# Patient Record
Sex: Female | Born: 1995 | Race: Asian | Hispanic: No | Marital: Married | Smoking: Former smoker
Health system: Southern US, Community
[De-identification: ages and names within clinical notes are randomized; demographics above are authoritative.]

## PROBLEM LIST (undated history)

## (undated) DIAGNOSIS — O24419 Gestational diabetes mellitus in pregnancy, unspecified control: Secondary | ICD-10-CM

## (undated) HISTORY — PX: WISDOM TOOTH EXTRACTION: SHX21

---
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
## 2016-07-08 ENCOUNTER — Emergency Department (HOSPITAL_COMMUNITY)
Admission: EM | Admit: 2016-07-08 | Discharge: 2016-07-08 | Disposition: A | Discharge status: Home / Self Care | Payer: Medicaid Other | Attending: Emergency Medicine | Admitting: Emergency Medicine

## 2016-07-08 ENCOUNTER — Encounter (HOSPITAL_COMMUNITY): Payer: Self-pay | Admitting: *Deleted

## 2016-07-08 ENCOUNTER — Emergency Department (HOSPITAL_COMMUNITY): Payer: Medicaid Other

## 2016-07-08 DIAGNOSIS — F17200 Nicotine dependence, unspecified, uncomplicated: Secondary | ICD-10-CM | POA: Diagnosis not present

## 2016-07-08 DIAGNOSIS — J069 Acute upper respiratory infection, unspecified: Secondary | ICD-10-CM | POA: Insufficient documentation

## 2016-07-08 DIAGNOSIS — B9789 Other viral agents as the cause of diseases classified elsewhere: Secondary | ICD-10-CM

## 2016-07-08 DIAGNOSIS — R05 Cough: Secondary | ICD-10-CM | POA: Diagnosis present

## 2016-07-08 MED ORDER — PSEUDOEPHEDRINE HCL 60 MG PO TABS: 60.0000 mg | ORAL_TABLET | Freq: Four times a day (QID) | ORAL | 0 refills | Status: DC | PRN

## 2016-07-08 MED ORDER — BENZONATATE 100 MG PO CAPS: 200.0000 mg | ORAL_CAPSULE | Freq: Two times a day (BID) | ORAL | 0 refills | Status: DC | PRN

## 2016-07-08 NOTE — ED Notes (Signed)
Patient in Xray, mom taken into pts room

## 2016-07-08 NOTE — Discharge Instructions (Signed)
Take your medications as prescribed. I also recommend continuing to drink fluids at stay hydrated at home, using a humidifier and voice rest. Please follow up with a primary care provider from the Resource Guide provided below in 1 week as needed. Please return to the Emergency Department if symptoms worsen or new onset of fever, headache, neck stiffness, productive cough, coughing up blood, difficulty breathing, chest pain, vomiting.

## 2016-07-08 NOTE — ED Provider Notes (Signed)
[facility]-EMERGENCY DEPT Provider Note   CSN: [id number] Arrival date & time: 07/08/16  [id number]     History   Chief Complaint Chief Complaint  Patient presents with  . Cough    HPI Peggy Wise is a 20 y.o. female.  Patient is a 20 year old female with no pertinent past medical history who presents the ED with complaint of cough, onset 3 days. Patient reports on her flight home from [place] she began having a nonproductive cough. Endorses associated nasal congestion and hoarse voice from coughin. Patient states she has been taking Delsym without relief. Denies fever, chills, body ache, headache, lightheadedness, dizziness, neck stiffness, ear pain, rhinorrhea, sore throat, hemoptysis, shortness of breath, wheezing, chest pain, abdominal pain, nausea, vomiting, diarrhea, urinary symptoms, numbness, tingling, weakness, rash. Patient reports all of her immunizations are up-to-date and notes she received the typhoid vaccine prior to her trip. 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 Denies any known sick contacts.      History reviewed. No pertinent past medical history.  There are no active problems to display for this patient.   History reviewed. No pertinent surgical history.  OB History    No data available       Home Medications    Prior to Admission medications   Medication Sig Start Date End Date Taking? Authorizing Provider  norethindrone-ethinyl estradiol (LOESTRIN 1/20, 21,) 1-20 MG-MCG tablet Take 1 tablet by mouth daily.   Yes Historical Provider, MD  benzonatate (TESSALON) 100 MG capsule Take 2 capsules (200 mg total) by mouth 2 (two) times daily as needed for cough. 07/08/16   Barrett HenleNicole Elizabeth Wyoma Genson, PA-C  dicyclomine (BENTYL) 20 MG tablet Take 1 tablet (20 mg total) by mouth 4 (four) times daily -  before meals and at bedtime. 01/22/15 01/24/15  Tamika Bush, DO  pseudoephedrine (SUDAFED) 60 MG tablet Take 1 tablet (60 mg total) by  mouth every 6 (six) hours as needed for congestion. 07/08/16   Barrett HenleNicole Elizabeth Hillary Schwegler, PA-C    Family History History reviewed. No pertinent family history.  Social History Social History  Substance Use Topics  . Smoking status: Current Every Day Smoker  . Smokeless tobacco: Never Used  . Alcohol use No     Allergies   Review of patient's allergies indicates no known allergies.   Review of Systems Review of Systems  HENT: Positive for congestion and voice change (hoarse).   Respiratory: Positive for cough.   All other systems reviewed and are negative.    Physical Exam Updated Vital Signs BP 115/78 (BP Location: Left Arm)   Pulse 76   Temp 98.1 F (36.7 C) (Oral)   Resp 18   Ht 5\' 2"  (1.575 m)   Wt 48.3 kg   LMP 07/05/2016   SpO2 97%   BMI 19.48 kg/m   Physical Exam  Constitutional: She is oriented to person, place, and time. She appears well-developed and well-nourished. No distress.  HENT:  Head: Normocephalic and atraumatic.  Right Ear: Tympanic membrane normal.  Left Ear: Tympanic membrane normal.  Nose: Nose normal. Right sinus exhibits no maxillary sinus tenderness and no frontal sinus tenderness. Left sinus exhibits no maxillary sinus tenderness and no frontal sinus tenderness.  Mouth/Throat: Uvula is midline and mucous membranes are normal. Posterior oropharyngeal erythema present. No oropharyngeal exudate, posterior oropharyngeal edema or tonsillar abscesses.  Eyes: Conjunctivae and EOM are normal. Right eye exhibits no discharge. Left eye exhibits no discharge. No scleral  icterus.  Neck: Normal range of motion. Neck supple.  Cardiovascular: Normal rate, regular rhythm, normal heart sounds and intact distal pulses.   Pulmonary/Chest: Effort normal and breath sounds normal. No respiratory distress. She has no wheezes. She has no rales. She exhibits no tenderness.  Abdominal: Soft. She exhibits no distension. There is no tenderness.  Musculoskeletal:  Normal range of motion. She exhibits no edema.  Lymphadenopathy:    She has no cervical adenopathy.  Neurological: She is alert and oriented to person, place, and time.  Skin: Skin is warm and dry. No rash noted. She is not diaphoretic.  Nursing note and vitals reviewed.    ED Treatments / Results  Labs (all labs ordered are listed, but only abnormal results are displayed) Labs Reviewed - No data to display  EKG  EKG Interpretation None       Radiology Dg Chest 2 View  Result Date: 07/08/2016 CLINICAL DATA:  Cough and congestion for several days, smoker EXAM: CHEST  2 VIEW COMPARISON:  None FINDINGS: Normal heart size, mediastinal contours, and pulmonary vascularity. Lungs minimally hyperinflated but clear. No pleural effusion or pneumothorax. Bones unremarkable. IMPRESSION: Minimal hyperinflation without infiltrate. Electronically Signed   By: Ulyses SouthwardMark  Boles M.D.   On: 07/08/2016 11:26    Procedures Procedures (including critical care time)  Medications Ordered in ED Medications - No data to display   Initial Impression / Assessment and Plan / ED Course  I have reviewed the triage vital signs and the nursing notes.  Pertinent labs & imaging results that were available during my care of the patient were reviewed by me and considered in my medical decision making (see chart for details).  Clinical Course    Pt presents with a nonproductive cough for the past 3 days which started on her flight home form [place], [place]. Associated nasal congestion and hoarse voice from coughing. Denies fever or rash. Immunizations UTD, pt received typhoid vaccine prior to her trip. VSS. Exam revealed erythematous posterior oropharynx, lungs CTAB, MMM, remaining exam benign. CXR negative. I suspect pt's sxs are likely due to viral URI. Plan to d/c pt home with antitussive, decongestant and symptomatic tx. Advised pt to follow up with PCP. Discussed return precautions with pt.   Final  Clinical Impressions(s) / ED Diagnoses   Final diagnoses:  Viral URI with cough    New Prescriptions Discharge Medication List as of 07/08/2016 12:15 PM    START taking these medications   Details  benzonatate (TESSALON) 100 MG capsule Take 2 capsules (200 mg total) by mouth 2 (two) times daily as needed for cough., Starting Wed 07/08/2016, Print    pseudoephedrine (SUDAFED) 60 MG tablet Take 1 tablet (60 mg total) by mouth every 6 (six) hours as needed for congestion., Starting Wed 07/08/2016, Print         Satira Sarkicole Elizabeth RollingwoodNadeau, [place] JerseyPA-C 07/08/16 1229    Donnetta HutchingBrian Cook, MD 07/12/16 [phone]

## 2016-07-08 NOTE — ED Triage Notes (Signed)
Pt reports recent travel to [place], came back on Monday. Only symptom is non productive cough, denies fever, rash or flu like symptoms. Mask on pt at triage.

## 2018-01-28 ENCOUNTER — Encounter (HOSPITAL_COMMUNITY): Payer: Self-pay | Admitting: Emergency Medicine

## 2018-01-28 ENCOUNTER — Other Ambulatory Visit: Payer: Self-pay

## 2018-01-28 ENCOUNTER — Ambulatory Visit (HOSPITAL_COMMUNITY)
Admission: EM | Admit: 2018-01-28 | Discharge: 2018-01-28 | Disposition: A | Discharge status: Home / Self Care | Payer: Medicaid Other | Attending: Internal Medicine | Admitting: Internal Medicine

## 2018-01-28 DIAGNOSIS — H9193 Unspecified hearing loss, bilateral: Secondary | ICD-10-CM | POA: Diagnosis not present

## 2018-01-28 DIAGNOSIS — H6123 Impacted cerumen, bilateral: Secondary | ICD-10-CM

## 2018-01-28 MED ORDER — NEOMYCIN-POLYMYXIN-HC 3.5-10000-1 OT SUSP: 4.0000 [drp] | Freq: Four times a day (QID) | OTIC | 0 refills | Status: AC

## 2018-01-28 NOTE — ED Triage Notes (Signed)
Pt c/o not being able to hear well out of her R ear, unsure if her L ear is affected. Has tried OTC ear drops, has tried a bulb syringe without improvement of hearing.

## 2018-01-28 NOTE — Discharge Instructions (Signed)
Ear wax removed today. We were not able to remove all of the ear wax. Please continue with over the counter ear wax softener/hydrogen peroxide to help break off the ear wax. As discussed, your ear canal is red prior to us irrigating the ear, start cortisporin drops to prevent outer ear infection as we have been irritating your ear canal with the irrigation. Follow up with PCP or here if symptoms not improving.

## 2018-01-28 NOTE — ED Provider Notes (Signed)
[facility]    CSN: [id number] Arrival date & time: 01/28/18  1217     History   Chief Complaint Chief Complaint  Patient presents with  . Cerumen Impaction    HPI Amie PortlandSachi Andrzejewski is a 22 y.o. female.   22 year old female comes in for 3-day history of right ear fullness with decrease of hearing.  Denies any pain, drainage.  States did try to clean the ear with cotton swab.  otherwise denies recent swimming, altitude change.  Denies URI symptoms such a cough, congestion, sore throat. Has also tried using a bulb syringe and eardrops without improvement.      History reviewed. No pertinent past medical history.  There are no active problems to display for this patient.   History reviewed. No pertinent surgical history.  OB History    No data available       Home Medications    Prior to Admission medications   Medication Sig Start Date End Date Taking? Authorizing Provider  benzonatate (TESSALON) 100 MG capsule Take 2 capsules (200 mg total) by mouth 2 (two) times daily as needed for cough. Patient not taking: Reported on 01/28/2018 07/08/16   Barrett HenleNadeau, Nicole Elizabeth, PA-C  dicyclomine (BENTYL) 20 MG tablet Take 1 tablet (20 mg total) by mouth 4 (four) times daily -  before meals and at bedtime. Patient not taking: Reported on 01/28/2018 01/22/15 01/24/15  Truddie CocoBush, Tamika, DO  neomycin-polymyxin-hydrocortisone (CORTISPORIN) 3.5-10000-1 OTIC suspension Place 4 drops into the right ear 4 (four) times daily for 7 days. 01/28/18 02/04/18  Belinda FisherYu, Aren Pryde V, PA-C  norethindrone-ethinyl estradiol (LOESTRIN 1/20, 21,) 1-20 MG-MCG tablet Take 1 tablet by mouth daily.    [provider]  pseudoephedrine (SUDAFED) 60 MG tablet Take 1 tablet (60 mg total) by mouth every 6 (six) hours as needed for congestion. Patient not taking: Reported on 01/28/2018 07/08/16   Barrett HenleNadeau, Nicole Elizabeth, PA-C    Family History No family history on file.  Social History Social History   Tobacco Use  .  Smoking status: Current Every Day Smoker  . Smokeless tobacco: Never Used  Substance Use Topics  . Alcohol use: No  . Drug use: No     Allergies   Patient has no known allergies.   Review of Systems Review of Systems  Reason unable to perform ROS: See HPI as above.     Physical Exam Triage Vital Signs ED Triage Vitals [01/28/18 1250]  Enc Vitals Group     BP 116/66     Pulse Rate 81     Resp 18     Temp 98.6 F (37 C)     Temp src      SpO2 100 %     Weight      Height      Head Circumference      Peak Flow      Pain Score      Pain Loc      Pain Edu?      Excl. in GC?    No data found.  Updated Vital Signs BP 116/66   Pulse 81   Temp 98.6 F (37 C)   Resp 18   LMP 01/07/2018   SpO2 100%   Physical Exam  Constitutional: She is oriented to person, place, and time. She appears well-developed and well-nourished. No distress.  HENT:  Head: Normocephalic and atraumatic.  No tenderness on palpation of bilateral tragus.  Cerumen impaction bilaterally, TM not visible.  Slight erythema  of the right ear canal.  Eyes: Conjunctivae are normal. Pupils are equal, round, and reactive to light.  Neurological: She is alert and oriented to person, place, and time.     UC Treatments / Results  Labs (all labs ordered are listed, but only abnormal results are displayed) Labs Reviewed - No data to display  EKG  EKG Interpretation None       Radiology No results found.  Procedures Procedures (including critical care time)  Medications Ordered in UC Medications - No data to display   Initial Impression / Assessment and Plan / UC Course  I have reviewed the triage vital signs and the nursing notes.  Pertinent labs & imaging results that were available during my care of the patient were reviewed by me and considered in my medical decision making (see chart for details).    Patient with much improved symptoms after irrigation.  Still with some cerumen, TM  not completely visible.  Will have patient continue hydrogen peroxide, OTC cerumen softener to help with residual cerumen.  Patient with erythematous ear canal prior to irrigation, will cover patient for otitis externa with Cortisporin.  Return precautions given.  Patient expresses understanding and agrees with plan.  Final Clinical Impressions(s) / UC Diagnoses   Final diagnoses:  Bilateral impacted cerumen    ED Discharge Orders        Ordered    neomycin-polymyxin-hydrocortisone (CORTISPORIN) 3.5-10000-1 OTIC suspension  4 times daily     01/28/18 1453        Belinda Fisher, PA-C 01/28/18 1511

## 2019-04-12 ENCOUNTER — Emergency Department (HOSPITAL_COMMUNITY): Payer: Medicaid Other

## 2019-04-12 ENCOUNTER — Other Ambulatory Visit: Payer: Self-pay

## 2019-04-12 ENCOUNTER — Emergency Department (HOSPITAL_COMMUNITY)
Admission: EM | Admit: 2019-04-12 | Discharge: 2019-04-12 | Disposition: A | Discharge status: Home / Self Care | Payer: Medicaid Other | Attending: Emergency Medicine | Admitting: Emergency Medicine

## 2019-04-12 DIAGNOSIS — M25831 Other specified joint disorders, right wrist: Secondary | ICD-10-CM | POA: Insufficient documentation

## 2019-04-12 DIAGNOSIS — F17210 Nicotine dependence, cigarettes, uncomplicated: Secondary | ICD-10-CM | POA: Insufficient documentation

## 2019-04-12 DIAGNOSIS — Z79899 Other long term (current) drug therapy: Secondary | ICD-10-CM | POA: Insufficient documentation
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
## 2019-04-12 NOTE — Discharge Instructions (Signed)
There were no noted abnormalities on the x-ray. May try applying heat and massage in the region gently. Follow-up with the hand specialist for further options.

## 2019-04-12 NOTE — ED Notes (Signed)
Patient verbalizes understanding of discharge instructions. Opportunity for questioning and answering were provided.  patient discharged from ED.  

## 2019-04-12 NOTE — ED Triage Notes (Signed)
Pt states that about a year ago she hurt her right wrist and formed a "lump "  To the area ; pt state she never seen a doctor for it ; pt states the pain has been getting worse these past couple of days but hasnt taken anything to relieve the pain

## 2019-10-16 ENCOUNTER — Other Ambulatory Visit: Payer: Self-pay

## 2019-10-16 DIAGNOSIS — Z20822 Contact with and (suspected) exposure to covid-19: Secondary | ICD-10-CM

## 2019-10-17 LAB — NOVEL CORONAVIRUS, NAA: SARS-CoV-2, NAA: NOT DETECTED

## 2021-01-06 ENCOUNTER — Telehealth: Payer: Self-pay

## 2021-01-06 ENCOUNTER — Other Ambulatory Visit: Payer: Self-pay

## 2021-01-06 DIAGNOSIS — N6310 Unspecified lump in the right breast, unspecified quadrant: Secondary | ICD-10-CM

## 2021-01-06 NOTE — Telephone Encounter (Signed)
Patient left message requesting information regarding a free mammogram. Left message on voicemail requesting a return call.

## 2021-01-07 ENCOUNTER — Other Ambulatory Visit: Payer: Self-pay

## 2021-01-07 ENCOUNTER — Ambulatory Visit: Payer: Medicaid Other | Admitting: *Deleted

## 2021-01-07 VITALS — BP 118/78 | Wt 134.2 lb

## 2021-01-07 DIAGNOSIS — N6314 Unspecified lump in the right breast, lower inner quadrant: Secondary | ICD-10-CM

## 2021-01-07 DIAGNOSIS — N644 Mastodynia: Secondary | ICD-10-CM

## 2021-01-07 DIAGNOSIS — Z1239 Encounter for other screening for malignant neoplasm of breast: Secondary | ICD-10-CM

## 2021-01-07 NOTE — Patient Instructions (Signed)
Explained breast self awareness with [name] Patient did not need a Pap smear today due to last Pap smear was in January 2021 per patient. Let her know [facility] will cover Pap smears every 3 years unless has a history of abnormal Pap smears. Referred patient to the [facility] for a right breast ultrasound. Appointment scheduled Thursday, January 09, 2021 at 1020. Patient aware of appointment and will be there. Peggy Wise verbalized understanding.  Juron Vorhees, Kathaleen Maser, RN 10:42 AM

## 2021-01-07 NOTE — Progress Notes (Signed)
Ms. Unity Julin is a 25 y.o. female who presents to [facility] clinic today with complaint of right breast lump and pain x one week. Patient states the pain is at her nipple area. Patient states the pain comes and goes. Patient rates the pain at a 4 out of 10.    Pap Smear: Pap smear not completed today. Last Pap smear was in January 2021 at [facility] and was normal per patient. Per patient has no history of an abnormal Pap smear. Last Pap smear result is not available in Epic.   Physical exam: Breasts Breasts symmetrical. No skin abnormalities bilateral breasts. No nipple retraction bilateral breasts. No nipple discharge bilateral breasts. No lymphadenopathy. No lumps palpated left breast. Palpated a bb sized lump within the right breast at 4 o'clock next to the areola. Complaints of right inner breast tenderness on exam.     Pelvic/Bimanual Pap is not indicated today per [facility]CP guidelines.   Smoking History: Patient is a former smoker that quit in August 2021.   Patient Navigation: Patient education provided. Access to services provided for patient through [facility]CP program.    Breast and Cervical Cancer Risk Assessment: Patient does not have family history of breast cancer, known genetic mutations, or radiation treatment to the chest before age 69. Patient does not have history of cervical dysplasia, immunocompromised, or DES exposure in-utero. Breast cancer risk assessment completed. No breast cancer risk calculated due to patient is less than 14 years old.  Risk Assessment    Risk Scores      01/07/2021   Last edited by: Narda Rutherford, LPN   5-year risk:    Lifetime risk:           A: [facility]CP exam without pap smear Complaint of right breast lump and pain.  P: Referred patient to the [facility] for a right breast ultrasound. Appointment scheduled Thursday, January 09, 2021 at 1020.  Priscille Heidelberg, RN 01/07/2021 10:42 AM

## 2021-01-09 ENCOUNTER — Ambulatory Visit
Admission: RE | Admit: 2021-01-09 | Discharge: 2021-01-09 | Disposition: A | Discharge status: Home / Self Care | Payer: No Typology Code available for payment source | Source: Ambulatory Visit | Attending: Obstetrics and Gynecology | Admitting: Obstetrics and Gynecology

## 2021-01-09 ENCOUNTER — Other Ambulatory Visit: Payer: Self-pay

## 2021-01-09 DIAGNOSIS — N6310 Unspecified lump in the right breast, unspecified quadrant: Secondary | ICD-10-CM

## 2021-11-12 LAB — HEPATITIS C ANTIBODY: HCV Ab: NEGATIVE

## 2021-11-13 LAB — OB RESULTS CONSOLE HIV ANTIBODY (ROUTINE TESTING): HIV: NONREACTIVE

## 2021-11-13 LAB — OB RESULTS CONSOLE RPR: RPR: NONREACTIVE

## 2021-11-13 LAB — OB RESULTS CONSOLE RUBELLA ANTIBODY, IGM: Rubella: IMMUNE

## 2021-11-13 LAB — OB RESULTS CONSOLE HEPATITIS B SURFACE ANTIGEN: Hepatitis B Surface Ag: NEGATIVE

## 2021-11-14 LAB — OB RESULTS CONSOLE GC/CHLAMYDIA
Chlamydia: NEGATIVE
Neisseria Gonorrhea: NEGATIVE

## 2021-11-23 NOTE — L&D Delivery Note (Signed)
Delivery Note At 3:03 PM a viable female was delivered via  (Presentation:      ).  APGAR: ,not recorede yet.  However baby crying and moving vigorously during skin to skin weight  .   Placenta status: Spontaneous, Intact.  Cord:   with the following complications:  .  Cord pH: na  Anesthesia:  Epidural Episiotomy: None Lacerations: 2nd degree;Sulcus Suture Repair: 2.0 chromic only needed on right suchal tear.   Est. Blood Loss (mL):    Mom to postpartum.  Baby to Couplet care / Skin to Skin.  Margy Sumler A Deyon Chizek 05/25/2022, 3:39 PM

## 2022-04-01 ENCOUNTER — Encounter: Payer: Medicaid Other | Attending: Obstetrics and Gynecology | Admitting: Registered"

## 2022-04-01 DIAGNOSIS — O24419 Gestational diabetes mellitus in pregnancy, unspecified control: Secondary | ICD-10-CM | POA: Diagnosis present

## 2022-04-01 DIAGNOSIS — Z713 Dietary counseling and surveillance: Secondary | ICD-10-CM | POA: Diagnosis not present

## 2022-04-03 ENCOUNTER — Encounter: Payer: Self-pay | Admitting: Registered"

## 2022-04-03 NOTE — Progress Notes (Signed)
Patient was seen on 04/01/22 for Gestational Diabetes self-management class at [facility]. The following learning objectives were met by the patient during this course: ? ?States the definition of Gestational Diabetes ?States why dietary management is important in controlling blood glucose ?Describes the effects each nutrient has on blood glucose levels ?Demonstrates ability to create a balanced meal plan ?Demonstrates carbohydrate counting  ?States when to check blood glucose levels ?Demonstrates proper blood glucose monitoring techniques ?States the effect of stress and exercise on blood glucose levels ?States the importance of limiting caffeine and abstaining from alcohol and smoking ? ?Blood glucose monitor given: none. No samples in stock ? ?Patient instructed to monitor glucose levels: ?FBS: 60 - <95; 1 hour: <140; 2 hour: <120 ? ?Patient received handouts: ?Nutrition Diabetes and Pregnancy, including carb counting list ? ?Patient will be seen for follow-up as needed. ?

## 2022-05-04 IMAGING — US US BREAST*R* LIMITED INC AXILLA
1 series · 5 of 5 positions shown · non-contrast
Comparison: None.

CLINICAL DATA: Mass felt by the patient 1.5 weeks ago and on recent
physical examination in the 5 o'clock position of the right breast.
She cannot feel the mass today. She also reported an area that she
felt more medially in the inferior right breast which she can also
not feel today. No family history of breast cancer.

EXAM:
ULTRASOUND OF THE RIGHT BREAST

[Series 1: us breast*right* limited inc axilla · 0.06mm/px · 5 of 5 slices shown]
[im 1/5]
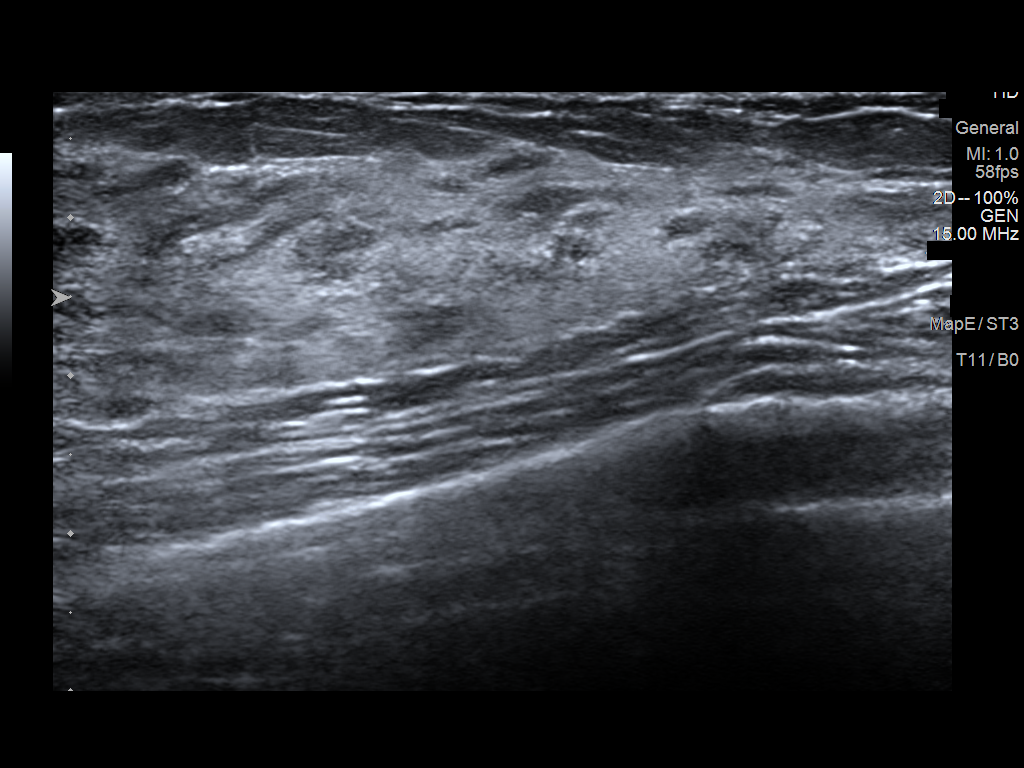
[im 2/5]
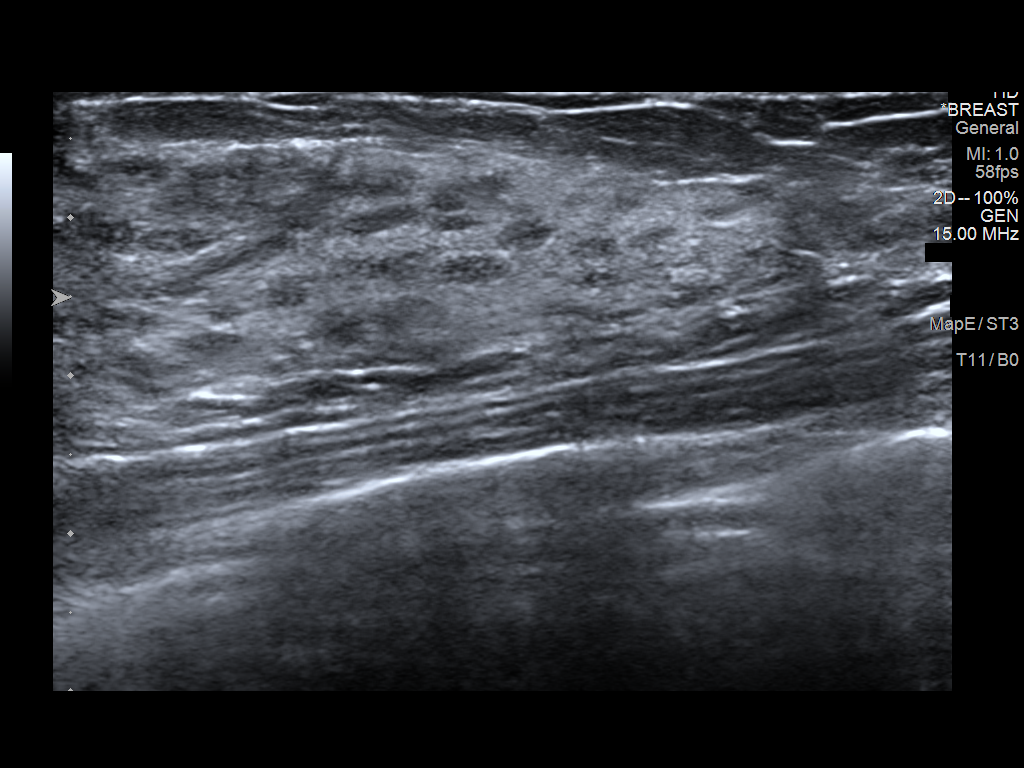
[im 3/5]
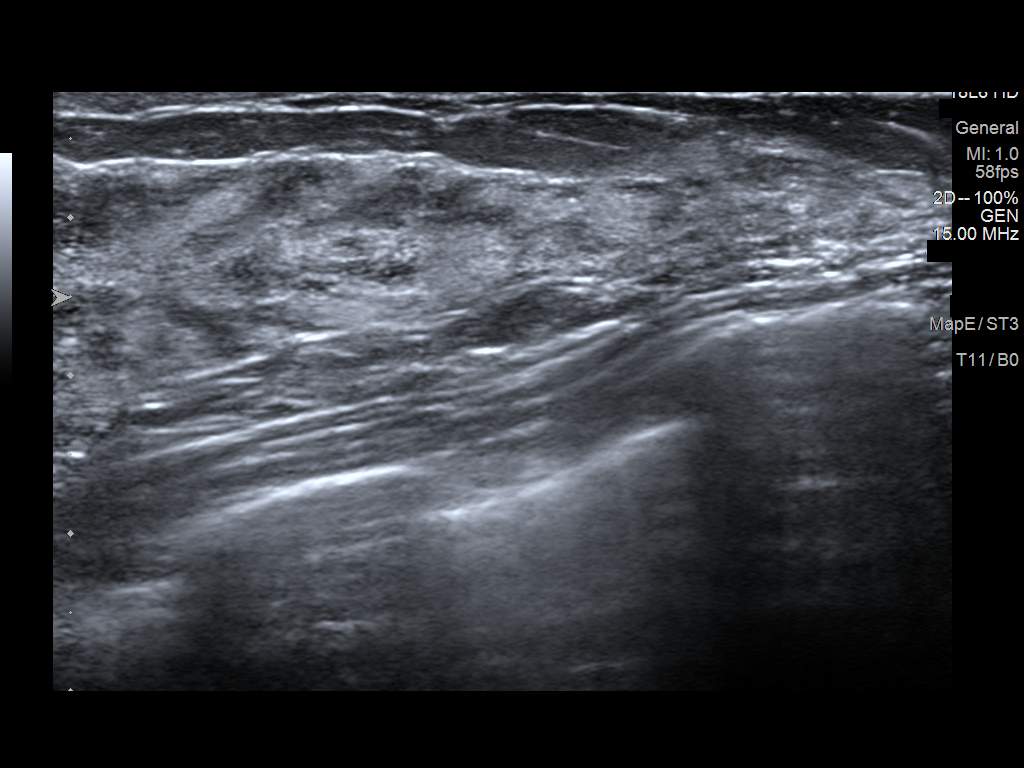
[im 4/5]
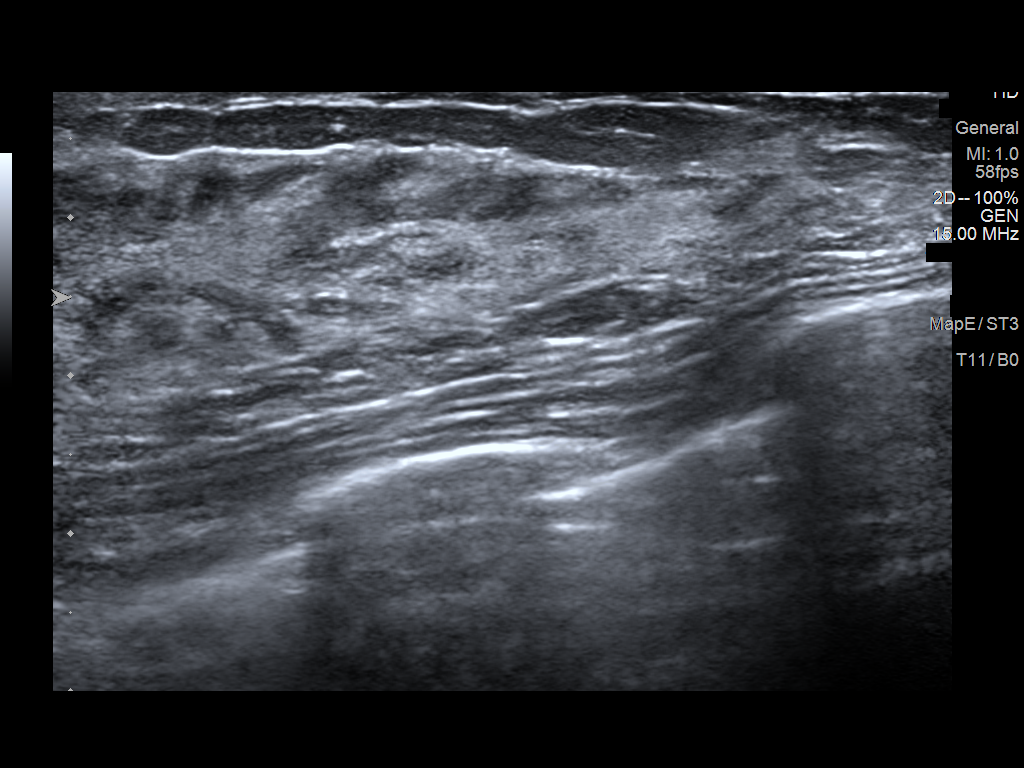
[im 5/5]
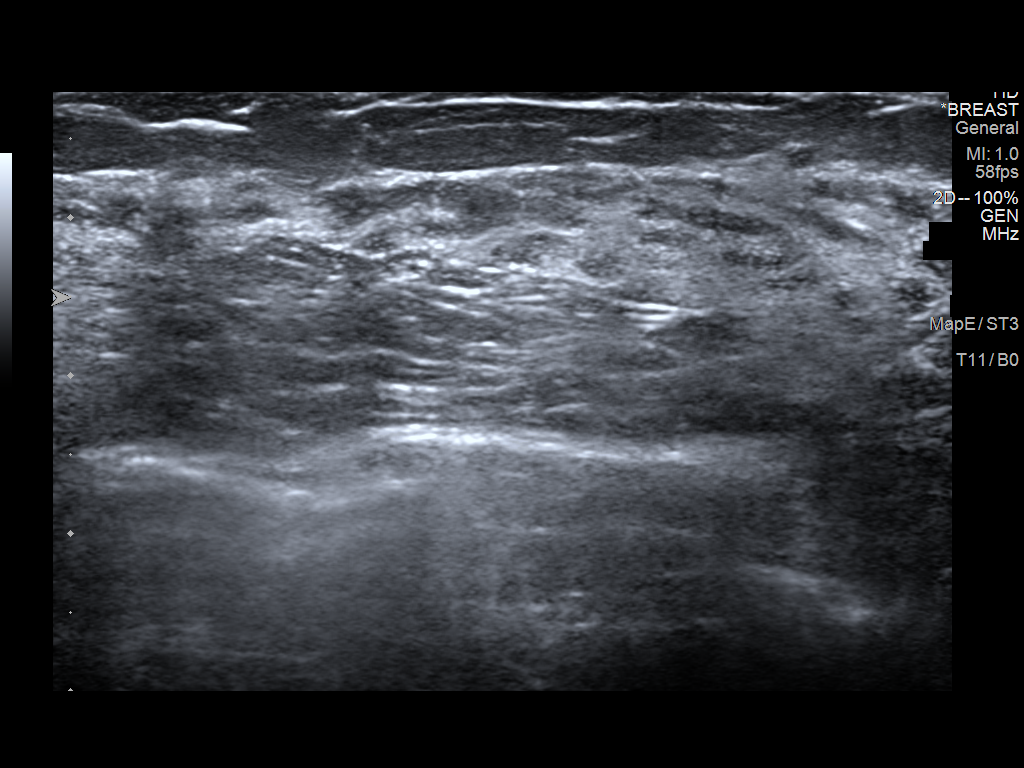

[5 of 5 positions shown; findings below may reference images not displayed]

FINDINGS: On physical exam, there is no palpable mass in the inferior right
breast today in the areas of recent palpable concern.

Targeted ultrasound is performed, showing normal appearing breast
tissue throughout the inferior right breast in the areas of recent
palpable concern, including dense glandular tissue.
IMPRESSION: No evidence of malignancy.

RECOMMENDATION:
Annual screening mammography beginning at age 40.

I have discussed the findings and recommendations with the patient.
If applicable, a reminder letter will be sent to the patient
regarding the next appointment.

BI-RADS CATEGORY  1: Negative.

## 2022-05-06 LAB — OB RESULTS CONSOLE GBS: GBS: NEGATIVE

## 2022-05-25 ENCOUNTER — Encounter (HOSPITAL_COMMUNITY): Payer: Self-pay

## 2022-05-25 ENCOUNTER — Inpatient Hospital Stay (HOSPITAL_COMMUNITY): Payer: Medicaid Other | Admitting: Anesthesiology

## 2022-05-25 ENCOUNTER — Inpatient Hospital Stay (HOSPITAL_COMMUNITY)
Admission: AD | Admit: 2022-05-25 | Discharge: 2022-05-26 | DRG: 807 | Disposition: A | Discharge status: Home / Self Care | Payer: Medicaid Other | Attending: Obstetrics & Gynecology | Admitting: Obstetrics & Gynecology

## 2022-05-25 DIAGNOSIS — O24420 Gestational diabetes mellitus in childbirth, diet controlled: Principal | ICD-10-CM | POA: Diagnosis present

## 2022-05-25 DIAGNOSIS — O26893 Other specified pregnancy related conditions, third trimester: Secondary | ICD-10-CM | POA: Diagnosis present

## 2022-05-25 DIAGNOSIS — O9902 Anemia complicating childbirth: Secondary | ICD-10-CM | POA: Diagnosis present

## 2022-05-25 DIAGNOSIS — Z3A38 38 weeks gestation of pregnancy: Secondary | ICD-10-CM

## 2022-05-25 DIAGNOSIS — Z87891 Personal history of nicotine dependence: Secondary | ICD-10-CM

## 2022-05-25 HISTORY — DX: Gestational diabetes mellitus in pregnancy, unspecified control: O24.419

## 2022-05-25 LAB — TYPE AND SCREEN
ABO/RH(D): A POS
Antibody Screen: NEGATIVE

## 2022-05-25 LAB — CBC
HCT: 36.9 % (ref 36.0–46.0)
Hemoglobin: 12.4 g/dL (ref 12.0–15.0)
MCH: 28 pg (ref 26.0–34.0)
MCHC: 33.6 g/dL (ref 30.0–36.0)
MCV: 83.3 fL (ref 80.0–100.0)
Platelets: 207 10*3/uL (ref 150–400)
RBC: 4.43 MIL/uL (ref 3.87–5.11)
RDW: 14.5 % (ref 11.5–15.5)
WBC: 7.9 10*3/uL (ref 4.0–10.5)
nRBC: 0 % (ref 0.0–0.2)

## 2022-05-25 LAB — GLUCOSE, CAPILLARY
Glucose-Capillary: 92 mg/dL (ref 70–99)
Glucose-Capillary: 111 mg/dL — ABNORMAL HIGH (ref 70–99)
Glucose-Capillary: 115 mg/dL — ABNORMAL HIGH (ref 70–99)
Glucose-Capillary: 112 mg/dL — ABNORMAL HIGH (ref 70–99)
Glucose-Capillary: 104 mg/dL — ABNORMAL HIGH (ref 70–99)

## 2022-05-25 LAB — RAPID HIV SCREEN (HIV 1/2 AB+AG)
HIV 1/2 Antibodies: NONREACTIVE
HIV-1 P24 Antigen - HIV24: NONREACTIVE

## 2022-05-25 LAB — RPR: RPR Ser Ql: NONREACTIVE

## 2022-05-25 MED ORDER — LACTATED RINGERS IV SOLN: INTRAVENOUS | Status: DC

## 2022-05-25 MED ORDER — SODIUM CHLORIDE 0.9 % IV SOLN: 250.0000 mL | INTRAVENOUS | Status: DC | PRN

## 2022-05-25 MED ORDER — OXYCODONE-ACETAMINOPHEN 5-325 MG PO TABS: 1.0000 | ORAL_TABLET | ORAL | Status: DC | PRN

## 2022-05-25 MED ORDER — OXYTOCIN BOLUS FROM INFUSION
333.0000 mL | Freq: Once | INTRAVENOUS | Status: AC
  Administered 2022-05-25: 333 mL via INTRAVENOUS

## 2022-05-25 MED ORDER — SIMETHICONE 80 MG PO CHEW: 80.0000 mg | CHEWABLE_TABLET | ORAL | Status: DC | PRN

## 2022-05-25 MED ORDER — OXYTOCIN-SODIUM CHLORIDE 30-0.9 UT/500ML-% IV SOLN
2.5000 [IU]/h | INTRAVENOUS | Status: DC
  Filled 2022-05-25: qty 500

## 2022-05-25 MED ORDER — COCONUT OIL OIL: 1.0000 | TOPICAL_OIL | Status: DC | PRN

## 2022-05-25 MED ORDER — DIBUCAINE (PERIANAL) 1 % EX OINT: 1.0000 | TOPICAL_OINTMENT | CUTANEOUS | Status: DC | PRN

## 2022-05-25 MED ORDER — TETANUS-DIPHTH-ACELL PERTUSSIS 5-2.5-18.5 LF-MCG/0.5 IM SUSY: 0.5000 mL | PREFILLED_SYRINGE | Freq: Once | INTRAMUSCULAR | Status: DC

## 2022-05-25 MED ORDER — OXYCODONE-ACETAMINOPHEN 5-325 MG PO TABS: 2.0000 | ORAL_TABLET | ORAL | Status: DC | PRN

## 2022-05-25 MED ORDER — ONDANSETRON HCL 4 MG/2ML IJ SOLN: 4.0000 mg | INTRAMUSCULAR | Status: DC | PRN

## 2022-05-25 MED ORDER — LACTATED RINGERS IV SOLN: 500.0000 mL | INTRAVENOUS | Status: DC | PRN

## 2022-05-25 MED ORDER — ONDANSETRON HCL 4 MG/2ML IJ SOLN
4.0000 mg | Freq: Four times a day (QID) | INTRAMUSCULAR | Status: DC | PRN
Start: 2022-05-25 — End: 2022-05-25

## 2022-05-25 MED ORDER — LIDOCAINE HCL (PF) 1 % IJ SOLN: 30.0000 mL | INTRAMUSCULAR | Status: DC | PRN

## 2022-05-25 MED ORDER — EPHEDRINE 5 MG/ML INJ: 10.0000 mg | INTRAVENOUS | Status: DC | PRN

## 2022-05-25 MED ORDER — IBUPROFEN 600 MG PO TABS
600.0000 mg | ORAL_TABLET | Freq: Four times a day (QID) | ORAL | Status: DC
  Administered 2022-05-25 – 2022-05-26 (×4): 600 mg via ORAL
  Filled 2022-05-25 (×5): qty 1

## 2022-05-25 MED ORDER — OXYTOCIN-SODIUM CHLORIDE 30-0.9 UT/500ML-% IV SOLN: 2.5000 [IU]/h | INTRAVENOUS | Status: DC

## 2022-05-25 MED ORDER — WITCH HAZEL-GLYCERIN EX PADS: 1.0000 | MEDICATED_PAD | CUTANEOUS | Status: DC | PRN

## 2022-05-25 MED ORDER — SOD CITRATE-CITRIC ACID 500-334 MG/5ML PO SOLN: 30.0000 mL | ORAL | Status: DC | PRN

## 2022-05-25 MED ORDER — SOD CITRATE-CITRIC ACID 500-334 MG/5ML PO SOLN
30.0000 mL | ORAL | Status: DC | PRN
Start: 2022-05-25 — End: 2022-05-25

## 2022-05-25 MED ORDER — OXYTOCIN BOLUS FROM INFUSION: 333.0000 mL | Freq: Once | INTRAVENOUS | Status: DC

## 2022-05-25 MED ORDER — FENTANYL CITRATE (PF) 100 MCG/2ML IJ SOLN
50.0000 ug | INTRAMUSCULAR | Status: DC | PRN
  Administered 2022-05-25: 100 ug via INTRAVENOUS
  Filled 2022-05-25: qty 2

## 2022-05-25 MED ORDER — DIPHENHYDRAMINE HCL 25 MG PO CAPS: 25.0000 mg | ORAL_CAPSULE | Freq: Four times a day (QID) | ORAL | Status: DC | PRN

## 2022-05-25 MED ORDER — ACETAMINOPHEN 500 MG PO TABS: 1000.0000 mg | ORAL_TABLET | Freq: Four times a day (QID) | ORAL | Status: DC | PRN

## 2022-05-25 MED ORDER — SODIUM CHLORIDE 0.9% FLUSH
3.0000 mL | Freq: Two times a day (BID) | INTRAVENOUS | Status: DC
Start: 2022-05-25 — End: 2022-05-25

## 2022-05-25 MED ORDER — SODIUM CHLORIDE 0.9 % IV SOLN
12.5000 mg | Freq: Four times a day (QID) | INTRAVENOUS | Status: DC | PRN
  Filled 2022-05-25: qty 0.5

## 2022-05-25 MED ORDER — ONDANSETRON HCL 4 MG/2ML IJ SOLN: 4.0000 mg | Freq: Four times a day (QID) | INTRAMUSCULAR | Status: DC | PRN

## 2022-05-25 MED ORDER — LIDOCAINE-EPINEPHRINE (PF) 2 %-1:200000 IJ SOLN
INTRAMUSCULAR | Status: DC | PRN
  Administered 2022-05-25: 5 mL via EPIDURAL

## 2022-05-25 MED ORDER — BENZOCAINE-MENTHOL 20-0.5 % EX AERO: 1.0000 | INHALATION_SPRAY | CUTANEOUS | Status: DC | PRN

## 2022-05-25 MED ORDER — DIPHENHYDRAMINE HCL 50 MG/ML IJ SOLN: 12.5000 mg | INTRAMUSCULAR | Status: DC | PRN

## 2022-05-25 MED ORDER — ONDANSETRON HCL 4 MG PO TABS: 4.0000 mg | ORAL_TABLET | ORAL | Status: DC | PRN

## 2022-05-25 MED ORDER — SODIUM CHLORIDE 0.9% FLUSH: 3.0000 mL | INTRAVENOUS | Status: DC | PRN

## 2022-05-25 MED ORDER — FLEET ENEMA 7-19 GM/118ML RE ENEM: 1.0000 | ENEMA | RECTAL | Status: DC | PRN

## 2022-05-25 MED ORDER — SENNOSIDES-DOCUSATE SODIUM 8.6-50 MG PO TABS
2.0000 | ORAL_TABLET | Freq: Every day | ORAL | Status: DC
  Administered 2022-05-26: 2 via ORAL
  Filled 2022-05-25: qty 2

## 2022-05-25 MED ORDER — ZOLPIDEM TARTRATE 5 MG PO TABS: 5.0000 mg | ORAL_TABLET | Freq: Every evening | ORAL | Status: DC | PRN

## 2022-05-25 MED ORDER — FENTANYL-BUPIVACAINE-NACL 0.5-0.125-0.9 MG/250ML-% EP SOLN
12.0000 mL/h | EPIDURAL | Status: DC | PRN
  Administered 2022-05-25: 12 mL/h via EPIDURAL
  Filled 2022-05-25: qty 250

## 2022-05-25 MED ORDER — PHENYLEPHRINE 80 MCG/ML (10ML) SYRINGE FOR IV PUSH (FOR BLOOD PRESSURE SUPPORT): 80.0000 ug | PREFILLED_SYRINGE | INTRAVENOUS | Status: DC | PRN

## 2022-05-25 MED ORDER — FENTANYL CITRATE (PF) 100 MCG/2ML IJ SOLN
INTRAMUSCULAR | Status: AC
  Administered 2022-05-25: 100 ug via INTRAVENOUS
  Filled 2022-05-25: qty 2

## 2022-05-25 MED ORDER — PRENATAL MULTIVITAMIN CH
1.0000 | ORAL_TABLET | Freq: Every day | ORAL | Status: DC
  Administered 2022-05-26: 1 via ORAL
  Filled 2022-05-25: qty 1

## 2022-05-25 MED ORDER — ACETAMINOPHEN 325 MG PO TABS: 650.0000 mg | ORAL_TABLET | ORAL | Status: DC | PRN

## 2022-05-25 MED ORDER — PHENYLEPHRINE 80 MCG/ML (10ML) SYRINGE FOR IV PUSH (FOR BLOOD PRESSURE SUPPORT)
80.0000 ug | PREFILLED_SYRINGE | INTRAVENOUS | Status: DC | PRN
  Filled 2022-05-25: qty 10

## 2022-05-25 MED ORDER — LACTATED RINGERS IV SOLN: 500.0000 mL | Freq: Once | INTRAVENOUS | Status: DC

## 2022-05-25 NOTE — Lactation Note (Signed)
This note was copied from a baby's chart. Lactation Consultation Note  Patient Name: Boy Catheleen Langhorne KMMNO'T Date: 05/25/2022 Reason for consult: L&D Initial assessment;Primapara;Early term 37-38.6wks Age:25 hours  Infant maintained latch, but only sporadically suckled. Dad was shown how to help with hand placement to promote continued suckling/maintaining of latch.   LC to f/u later.  Maternal Data Has patient been taught Hand Expression?: No Does the patient have breastfeeding experience prior to this delivery?: No  Feeding Mother's Current Feeding Choice: Breast Milk and Formula  LATCH Score Latch: Grasps breast easily, tongue down, lips flanged, rhythmical sucking.  Audible Swallowing: None  Type of Nipple: Everted at rest and after stimulation  Comfort (Breast/Nipple): Soft / non-tender  Hold (Positioning): Assistance needed to correctly position infant at breast and maintain latch.  LATCH Score: 7   Lactation Tools Discussed/Used    Interventions Interventions: Assisted with latch  Discharge WIC Program: Yes  Consult Status Consult Status: Follow-up from L&D    Lurline Hare [facility] 05/25/2022, 3:29 PM

## 2022-05-25 NOTE — Progress Notes (Signed)
Remote note Pt just received   IV pain meds BP 114/63   Pulse 80   Temp 98.4 F (36.9 C) (Oral)   Resp 18   Ht 5\' 2"  (1.575 m)   Wt 77.7 kg   SpO2 98%   BMI 31.31 kg/m   Cat 1 Toco q 1-2 min  Anticipate SVD

## 2022-05-25 NOTE — MAU Note (Signed)
..  Peggy Wise is a 26 y.o. at [redacted]w[redacted]d here in MAU reporting: Contractions since 11pm that are now every 5 minutes. Reports she lost her mucous plug yesterday and when she wipes she has some bloody show. Denies gushes of fluid. +FM   Onset of complaint: 11pm last night 7/2 Pain score: 9/10 Vitals:   05/25/22 0321  BP: 123/82  Pulse: 84  Resp: 16  Temp: 98.2 F (36.8 C)  SpO2: 99%     FHT:145

## 2022-05-25 NOTE — Anesthesia Preprocedure Evaluation (Signed)
Anesthesia Evaluation  Patient identified by MRN, date of birth, ID band Patient awake    Reviewed: Allergy & Precautions, NPO status , Patient's Chart, lab work & pertinent test results  Airway Mallampati: II  TM Distance: >3 FB Neck ROM: Full    Dental no notable dental hx.    Pulmonary neg pulmonary ROS, former smoker,    Pulmonary exam normal breath sounds clear to auscultation       Cardiovascular negative cardio ROS Normal cardiovascular exam Rhythm:Regular Rate:Normal     Neuro/Psych negative neurological ROS  negative psych ROS   GI/Hepatic negative GI ROS, Neg liver ROS,   Endo/Other  diabetes, Gestational  Renal/GU negative Renal ROS  negative genitourinary   Musculoskeletal negative musculoskeletal ROS (+)   Abdominal   Peds  Hematology negative hematology ROS (+)   Anesthesia Other Findings   Reproductive/Obstetrics (+) Pregnancy                             Anesthesia Physical Anesthesia Plan  ASA: 3  Anesthesia Plan: Epidural   Post-op Pain Management:    Induction:   PONV Risk Score and Plan: Treatment may vary due to age or medical condition  Airway Management Planned: Natural Airway  Additional Equipment:   Intra-op Plan:   Post-operative Plan:   Informed Consent: I have reviewed the patients History and Physical, chart, labs and discussed the procedure including the risks, benefits and alternatives for the proposed anesthesia with the patient or authorized representative who has indicated his/her understanding and acceptance.       Plan Discussed with: Anesthesiologist  Anesthesia Plan Comments: (Patient identified. Risks, benefits, options discussed with patient including but not limited to bleeding, infection, nerve damage, paralysis, failed block, incomplete pain control, headache, blood pressure changes, nausea, vomiting, reactions to medication,  itching, and post partum back pain. Confirmed with bedside nurse the patient's most recent platelet count. Confirmed with the patient that they are not taking any anticoagulation, have any bleeding history or any family history of bleeding disorders. Patient expressed understanding and wishes to proceed. All questions were answered. )        Anesthesia Quick Evaluation

## 2022-05-25 NOTE — H&P (Addendum)
Peggy Wise is a 26 y.o. female presenting for contractions and found to be in labor, 5cm dilated. G1P0 at 38 weeks 5 days EGA. LMP 08/27/2021. EDC 06/03/22 by LMP c/w first trimester U/S.  Prenatal care obtained at [facility]. Prenatal course significant for: Gestational diabetes mellitus, diet controlled.  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   OB History     Gravida  1   Para      Term      Preterm      AB      Living         SAB      IAB      Ectopic      Multiple      Live Births             Past Medical History:  Diagnosis Date   Gestational diabetes    A1   Past Surgical History:  Procedure Laterality Date   WISDOM TOOTH EXTRACTION     Family History: family history includes Diabetes in her mother. Social History:  reports that she quit smoking about 23 months ago. Her smoking use included cigarettes. She has never used smokeless tobacco. She reports that she does not drink alcohol and does not use drugs.     Maternal Diabetes: Yes:  Diabetes Type:  Diet controlled Genetic Screening: Normal Low risk panorama.  Maternal Ultrasounds/Referrals: Normal.  Last growth U/S 5/26: EFW 5lbs 6 oz (78th%), AFI 13. Fetal Ultrasounds or other Referrals:  None Maternal Substance Abuse:  No Significant Maternal Medications:  None Significant Maternal Lab Results:  Group B Strep negative Other Comments:  None  Review of Systems  Constitutional: Denies fevers/chills Cardiovascular: Denies chest pain or palpitations Pulmonary: Denies coughing or wheezing Gastrointestinal: Denies nausea, vomiting or diarrhea Genitourinary: Denies unusual vaginal bleeding, unusual vaginal discharge, dysuria, urgency or frequency.  With contractions.   Musculoskeletal: Denies muscle or joint aches and pain.  Neurology: Denies abnormal sensations such as tingling or  numbness.    Dilation: 5 Effacement (%): 100 Station: -1 Exam by:: Ginnie Smart RN Blood pressure 119/79, pulse 79, temperature 98.2 F (36.8 C), temperature source Oral, resp. rate 17, height 5\' 2"  (1.575 m), weight 77.7 kg, SpO2 98 %. NST: 140 baseline, moderate variability, reactive.  TOCO: With regular contractions every 2 to 3 minutes.  Exam Constitutional: She is oriented to person, place, and time. She appears well-developed and well-nourished.  HENT:  Head: Normocephalic and atraumatic.  Neck: Normal range of motion.  Cardiovascular: Normal rate.    Respiratory: Effort normal.   GI: Soft.  Skin: Skin is warm and dry.  Psychiatric: She has a normal mood and affect. Her behavior is normal.   Genitourinary: Gravid uterus, appropriate for gestational age.    Prenatal labs: ABO, Rh:  A positive Antibody:  Negative Rubella:  Immune RPR:   NR HBsAg:   Negative HIV:   NR GBS:   Negative  Current Outpatient Medications  Medication Instructions   benzonatate (TESSALON) 200 mg, Oral, 2 times daily PRN   dicyclomine (BENTYL) 20 mg, Oral, 3 times daily before meals & bedtime   norethindrone-ethinyl estradiol (LOESTRIN) 1-20 MG-MCG tablet 1 tablet, Oral, Daily   Omega-3 Fatty Acids (FISH OIL) 1000 MG CAPS 1 capsule, Oral, Daily   Prenatal Vit-Fe Fumarate-FA (PRENATAL VITAMIN PO) 1 tablet, Oral, Daily   pseudoephedrine (SUDAFED) 60 mg,  Oral, Every 6 hours PRN   VITAMIN D, CHOLECALCIFEROL, PO 1 capsule, Oral, Daily    No Known Allergies   Assessment/Plan: 26 y/o G1P0 at 38 weeks 5 days EGA in early labor, desires natural labor, Admit to Labor and Delivery May get epidural or nitrous oxide as desired Admit orders as placed in Anticipate vaginal delivery.   Yazleen Molock W Benito Lemmerman,MD.  05/25/2022, 4:15 AM

## 2022-05-25 NOTE — Lactation Note (Addendum)
This note was copied from a baby's chart. Lactation Consultation Note  Patient Name: Peggy Wise [id number] Date: 05/25/2022 Reason for consult: Mother's request;1st time breastfeeding;Early term 37-38.6wks Age:25 hours Mom would like latch assistance infant has not BF since delivery.  Mom latched infant on her right breast using the football hold position, initially infant was on and off breast but after 5 minutes infant started sustaining his latch, was still BF after 15 minutes when LC left the room.  Mom knows if infant is still cuing after latching on the 1st breast to offer him the second breast during the same feeding. Mom will continue to breastfeed infant according to hunger cues, on demand, 8 to 12 + times within 24 hours, skin to skin. Mom knows to continue to call RN/LC for further latch assistance if needed. Mom made aware of O/P services, breastfeeding support groups, community resources, and our phone # for post-discharge questions.   Maternal Data Has patient been taught Hand Expression?: Yes Does the patient have breastfeeding experience prior to this delivery?: No  Feeding Mother's Current Feeding Choice: Breast Milk and Formula  LATCH Score Latch: Repeated attempts needed to sustain latch, nipple held in mouth throughout feeding, stimulation needed to elicit sucking reflex.  Audible Swallowing: A few with stimulation  Type of Nipple: Everted at rest and after stimulation (slightly short shafted but  briefly hand expressing  prior to latch everts nipple shaft outward)  Comfort (Breast/Nipple): Soft / non-tender  Hold (Positioning): Assistance needed to correctly position infant at breast and maintain latch.  LATCH Score: 7   Lactation Tools Discussed/Used    Interventions Interventions: Assisted with latch;Skin to skin;Hand express;Breast compression;Adjust position;Support pillows;Position options;Education;LC Services brochure  Discharge Pump: Personal (Per  mom,she has ordered DEBP with her insurace that is being delivered to her home.)  Consult Status Consult Status: Follow-up Date: 05/26/22 Follow-up type: In-patient    Danelle Earthly 05/25/2022, 9:41 PM

## 2022-05-25 NOTE — Progress Notes (Signed)
Patient ID: Peggy Wise, female   DOB: 1996/02/08, 26 y.o.   MRN: [id number] Pt comfortable with epidural Cat 1 Toco q 2-3 minutes Complete and 0 station AROM clear Nurse will place foley and pt will begin to push Anticipate SVD

## 2022-05-25 NOTE — Anesthesia Procedure Notes (Signed)
Epidural Patient location during procedure: OB Start time: 05/25/2022 11:50 AM End time: 05/25/2022 12:00 PM  Staffing Anesthesiologist: Elmer Picker, MD Performed: anesthesiologist   Preanesthetic Checklist Completed: patient identified, IV checked, risks and benefits discussed, monitors and equipment checked, pre-op evaluation and timeout performed  Epidural Patient position: sitting Prep: DuraPrep and site prepped and draped Patient monitoring: continuous pulse ox, blood pressure, heart rate and cardiac monitor Approach: midline Location: L3-L4 Injection technique: LOR air  Needle:  Needle type: Tuohy  Needle gauge: 17 G Needle length: 9 cm Needle insertion depth: 5.5 cm Catheter type: closed end flexible Catheter size: 19 Gauge Catheter at skin depth: 11 cm Test dose: negative  Assessment Sensory level: T8 Events: blood not aspirated, injection not painful, no injection resistance, no paresthesia and negative IV test  Additional Notes Patient identified. Risks/Benefits/Options discussed with patient including but not limited to bleeding, infection, nerve damage, paralysis, failed block, incomplete pain control, headache, blood pressure changes, nausea, vomiting, reactions to medication both or allergic, itching and postpartum back pain. Confirmed with bedside nurse the patient's most recent platelet count. Confirmed with patient that they are not currently taking any anticoagulation, have any bleeding history or any family history of bleeding disorders. Patient expressed understanding and wished to proceed. All questions were answered. Sterile technique was used throughout the entire procedure. Please see nursing notes for vital signs. Test dose was given through epidural catheter and negative prior to continuing to dose epidural or start infusion. Warning signs of high block given to the patient including shortness of breath, tingling/numbness in hands, complete motor block,  or any concerning symptoms with instructions to call for help. Patient was given instructions on fall risk and not to get out of bed. All questions and concerns addressed with instructions to call with any issues or inadequate analgesia.  Reason for block:procedure for pain

## 2022-05-26 LAB — CBC
HCT: 29.3 % — ABNORMAL LOW (ref 36.0–46.0)
Hemoglobin: 9.9 g/dL — ABNORMAL LOW (ref 12.0–15.0)
MCH: 27.9 pg (ref 26.0–34.0)
MCHC: 33.8 g/dL (ref 30.0–36.0)
MCV: 82.5 fL (ref 80.0–100.0)
Platelets: 157 10*3/uL (ref 150–400)
RBC: 3.55 MIL/uL — ABNORMAL LOW (ref 3.87–5.11)
RDW: 14.6 % (ref 11.5–15.5)
WBC: 11.2 10*3/uL — ABNORMAL HIGH (ref 4.0–10.5)
nRBC: 0 % (ref 0.0–0.2)

## 2022-05-26 MED ORDER — IBUPROFEN 600 MG PO TABS: 600.0000 mg | ORAL_TABLET | Freq: Four times a day (QID) | ORAL | 0 refills | Status: AC

## 2022-05-26 MED ORDER — POLYSACCHARIDE IRON COMPLEX 150 MG PO CAPS
150.0000 mg | ORAL_CAPSULE | Freq: Every day | ORAL | Status: DC
  Administered 2022-05-26: 150 mg via ORAL
  Filled 2022-05-26: qty 1

## 2022-05-26 MED ORDER — POLYSACCHARIDE IRON COMPLEX 150 MG PO CAPS: 150.0000 mg | ORAL_CAPSULE | Freq: Every day | ORAL | 1 refills | Status: AC

## 2022-05-26 NOTE — Anesthesia Postprocedure Evaluation (Signed)
Anesthesia Post Note  Patient: Peggy Wise  Procedure(s) Performed: AN AD HOC LABOR EPIDURAL     Patient location during evaluation: Mother Baby Anesthesia Type: Epidural Level of consciousness: awake and alert and oriented Pain management: satisfactory to patient Vital Signs Assessment: post-procedure vital signs reviewed and stable Respiratory status: respiratory function stable Cardiovascular status: stable Postop Assessment: no headache, no backache, epidural receding, patient able to bend at knees, no signs of nausea or vomiting, adequate PO intake and able to ambulate Anesthetic complications: no   No notable events documented.  Last Vitals:  Vitals:   05/26/22 0147 05/26/22 0530  BP: 112/71 100/67  Pulse: 83 77  Resp: 18 18  Temp: 36.9 C 36.8 C  SpO2:  100%    Last Pain:  Vitals:   05/26/22 0818  TempSrc:   PainSc: 0-No pain   Pain Goal: Patients Stated Pain Goal: 0 (05/26/22 0530)                 Martin Smeal

## 2022-05-26 NOTE — Discharge Summary (Signed)
Postpartum Discharge Summary    Patient Name: Peggy Wise DOB: 1996/02/12 MRN: [id number]  Date of admission: 05/25/2022 Delivery date:05/25/2022  Delivering provider: Crawford Givens  Date of discharge: 05/26/2022  Admitting diagnosis: Normal labor [O80, Z37.9] Intrauterine pregnancy: [redacted]w[redacted]d    Secondary diagnosis:  Principal Problem:   Normal labor    Discharge diagnosis: Term Pregnancy Delivered and GDM A1                                              Post partum procedures: None Augmentation: AROM Complications: None  Hospital course: Onset of Labor With Vaginal Delivery      26y.o. yo G1P1001 at 376w5das admitted in Latent Labor on 05/25/2022. Patient had an uncomplicated labor course as follows:  Membrane Rupture Time/Date: 1:08 PM ,05/25/2022   Delivery Method:Vaginal, Spontaneous  Episiotomy: None  Lacerations:  2nd degree;Sulcus  Patient had an uncomplicated postpartum course.  She is ambulating, tolerating a regular diet, passing flatus, and urinating well. Patient is discharged home in stable condition on 05/26/22.  Newborn Data: Birth date:05/25/2022  Birth time:3:03 PM  Gender:Female  Living status:Living  Apgars:8 ,9  Weight:3360 g   Magnesium Sulfate received: No BMZ received: No Rhophylac:No MMR:No T-DaP:Given prenatally Flu: No Transfusion:No  Physical exam  Vitals:   05/25/22 2148 05/26/22 0147 05/26/22 0530 05/26/22 1648  BP: 125/72 112/71 100/67 114/71  Pulse: 82 83 77 81  Resp: 18 18 18 18   Temp: 98.9 F (37.2 C) 98.4 F (36.9 C) 98.2 F (36.8 C) 98 F (36.7 C)  TempSrc: Oral Oral Oral Oral  SpO2: 100%  100% 99%  Weight:      Height:       General: alert, cooperative, and no distress Lochia: appropriate Uterine Fundus: firm Incision: N/A DVT Evaluation: No evidence of DVT seen on physical exam. No significant calf/ankle edema. Labs: Lab Results  Component Value Date   WBC 11.2 (H) 05/26/2022   HGB 9.9 (L) 05/26/2022   HCT 29.3 (L)  05/26/2022   MCV 82.5 05/26/2022   PLT 157 05/26/2022   HIV: NON REACTIVE (07/03 0413)       No data to display         Edinburgh Score:    05/25/2022    4:40 PM  Edinburgh Postnatal Depression Scale Screening Tool  I have been able to laugh and see the funny side of things. 0  I have looked forward with enjoyment to things. 0  I have blamed myself unnecessarily when things went wrong. 2  I have been anxious or worried for no good reason. 2  I have felt scared or panicky for no good reason. 1  Things have been getting on top of me. 1  I have been so unhappy that I have had difficulty sleeping. 1  I have felt sad or miserable. 1  I have been so unhappy that I have been crying. 1  The thought of harming myself has occurred to me. 1  Edinburgh Postnatal Depression Scale Total 10     After visit meds:  Allergies as of 05/26/2022   No Known Allergies      Medication List     TAKE these medications    Fish Oil 1000 MG Caps Take 1,000 mg by mouth daily.   ibuprofen 600 MG tablet Commonly known as: ADVIL  Take 1 tablet (600 mg total) by mouth every 6 (six) hours. Start taking on: May 27, 2022   iron polysaccharides 150 MG capsule Commonly known as: NIFEREX Take 1 capsule (150 mg total) by mouth daily for 60 doses. Start taking on: May 27, 2022   PRENATAL VITAMIN PO Take 1 tablet by mouth daily.   VITAMIN D (CHOLECALCIFEROL) PO Take 1 capsule by mouth daily.       Discharge home in stable condition Infant Feeding: Breast Infant Disposition:home with mother Discharge instruction: per After Visit Summary and Postpartum booklet. Activity: Advance as tolerated. Pelvic rest for 6 weeks.  Diet: routine diet Follow up Visit:  [facility]. Schedule an appointment as soon as possible for a visit in 6 week(s).   Specialty: Obstetrics and Gynecology Why: 6 week postpartum check Contact information: [address] [place] [address] [phone]               - She was also advised to call to make a 1 week appointment at the office for a mood check given high postpartum Edinburgh score.   05/26/2022 Archie Endo, MD

## 2022-05-26 NOTE — Progress Notes (Addendum)
Post Partum Day 1 Subjective: no complaints, up ad lib, voiding, and tolerating PO.  Pain is well controlled. She is breastfeeding the baby. She has had small vaginal bleeding.   Objective: Blood pressure 100/67, pulse 77, temperature 98.2 F (36.8 C), temperature source Oral, resp. rate 18, height 5\' 2"  (1.575 m), weight 77.7 kg, SpO2 100 %, unknown if currently breastfeeding.  Physical Exam:  General: alert, cooperative, and no distress Lochia: appropriate Uterine Fundus: firm DVT Evaluation: No evidence of DVT seen on physical exam. No significant calf/ankle edema.  Recent Labs    05/25/22 0413 05/26/22 0507  HGB 12.4 9.9*  HCT 36.9 29.3*   CBC    Component Value Date/Time   WBC 11.2 (H) 05/26/2022 0507   RBC 3.55 (L) 05/26/2022 0507   HGB 9.9 (L) 05/26/2022 0507   HCT 29.3 (L) 05/26/2022 0507   PLT 157 05/26/2022 0507   MCV 82.5 05/26/2022 0507   MCH 27.9 05/26/2022 0507   MCHC 33.8 05/26/2022 0507   RDW 14.6 05/26/2022 0507    Assessment/Plan: 26 y/o G1P0 PPD # 1, stable Plan for discharge tomorrow, Breastfeeding, and Contraception Unsure, but is considering birth control pills versus nexplanon. - Iron for anemia - Routine postpartum care  - Discussed birth control options as well as symptoms of perinatal mood disorders to watch out for.   LOS: 1 day  26, MD 05/26/2022, 2:01 PM

## 2022-05-26 NOTE — Progress Notes (Signed)
CSW received consult due to score 10 on Edinburgh Depression Screen. CSW met with MOB at bedside to address consult, FOB was present and asleep. MOB granted CSW verbal permission to speak about anything in front of FOB. NP present and assessing infant, NP left the room after assessing infant. CSW introduced self and explained reason for consult. MOB was welcoming, pleasant, and remained engaged during assessment. CSW and MOB discussed edinburgh score 10 and answering yes (hardly ever) to question 10 (the thought of harming myself has occurred to me). MOB attributed elevated score to being anxious as she is a first time mom. CSW inquired about any additional stressors, MOB shared that they are currently in the process of closing on their home. CSW congratulated MOB and acknowledged how stressful that can be. CSW inquired about MOB answering yes to question 10 regarding having thoughts of self harm. MOB reported that she answered it as if it were asking about anytime in her life and not over the past seven days. MOB denied any current or recent thoughts of self harm. MOB denied any thoughts of self harm during pregnancy. CSW inquired about any mental health history, MOB denied any mental health history and shared that she is interested in seeing a therapist to process things. MOB denied having any symptoms of any mental health diagnoses. CSW provided local mental health resources. CSW inquired about how MOB was feeling emotionally since giving birth, MOB reported that she was feeling pretty good. CSW inquired about MOB's support system, MOB reported that her husband and parents are supports. MOB presented calm and did not demonstrate any acute mental health signs/symptoms. CSW assessed for safety, MOB denied SI and HI. CSW did not assess for domestic violence as FOB was present.   CSW provided education regarding Baby Blues vs PMADs and provided MOB with resources for mental health follow up.  CSW encouraged MOB to  evaluate her mental health throughout the postpartum period with the use of the New Mom Checklist developed by [facility] [employer] as well as the Edinburgh Postnatal Depression Scale and notify a medical professional if symptoms arise.     CSW provided review of Sudden Infant Death Syndrome (SIDS) precautions. MOB verbalized understanding and reported having all items needed to care for infant including a car seat and basinet. CSW asked if MOB was interested in parenting education support programs, MOB declined.   CSW identifies no further need for intervention and no barriers to discharge at this time.   Kimberly Long, LCSW Clinical Social Worker [facility] Cell#: [phone]  

## 2022-05-26 NOTE — Lactation Note (Signed)
This note was copied from a baby's chart. Lactation Consultation Note  Patient Name: Peggy Wise [id number] Date: 05/26/2022 Reason for consult: Follow-up assessment;Mother's request;Primapara;1st time breastfeeding;Early term 37-38.6wks;Breastfeeding assistance Age:25 hours  Mom feeding choice for now to EBF for first 24 hrs. LC provided a manual pump to pre pump before latching to stretch nipple.   Plan 1. To feed based on cues 8-12x 24hr period. Mom to offer breasts and look for signs of milk transfer.  2. If unable to latch,Mom to hand express and offer colostrum then try a latch.   All questions answered at the end of the visit. Mom did mention wanting to bottle feed at night with formula. We reviewed pace bottle feeding and supplementation volume guide reviewed and provided.  All questions answered at the end of the visit. Mom working on getting an Mining engineer pump from her insurance.   Maternal Data Has patient been taught Hand Expression?: Yes  Feeding Mother's Current Feeding Choice: Breast Milk  LATCH Score Latch: Grasps breast easily, tongue down, lips flanged, rhythmical sucking.  Audible Swallowing: Spontaneous and intermittent  Type of Nipple: Everted at rest and after stimulation  Comfort (Breast/Nipple): Soft / non-tender  Hold (Positioning): No assistance needed to correctly position infant at breast.  LATCH Score: 10   Lactation Tools Discussed/Used Tools: Pump;Flanges Flange Size: 21 Breast pump type: Manual Pump Education: Setup, frequency, and cleaning;Milk Storage Reason for Pumping: elongate nipple Pumping frequency: pre pump 5-6x strokes before latching.  Interventions Interventions: Breast feeding basics reviewed;Skin to skin;Hand express;Pre-pump if needed;Breast compression;Position options;Education;Pace feeding;LC Services brochure;Infant Driven Feeding Algorithm education  Discharge Pump: Manual WIC Program: No  Consult Status Consult  Status: Follow-up Date: 05/27/22 Follow-up type: In-patient    Joette Schmoker  Nicholson-Springer 05/26/2022, 12:05 PM

## 2022-06-02 ENCOUNTER — Telehealth (HOSPITAL_COMMUNITY): Payer: Self-pay | Admitting: *Deleted

## 2022-06-02 NOTE — Telephone Encounter (Signed)
Patient reports having developed "postpartum hives." Is using calamine lotion with relief of itching. RN instructed patient to contact OB for follow-up care. Also instructed patient to go to the ER or call 911 if she develops tongue swelling, throat constriction, or SOB. Patient verbalized understanding. Patient voiced no other questions or concerns regarding her health at this time. EPDS=3. Patient voiced no questions or concerns regarding infant at this time. Patient reports infant sleeps in a bassinet on his back. RN reviewed ABCs of safe sleep. Patient verbalized understanding. Patient requested RN email information on hospital's virtual postpartum classes and support groups. Email sent. Deforest Hoyles, RN, 06/02/22, [phone]

## 2022-06-03 ENCOUNTER — Inpatient Hospital Stay (HOSPITAL_COMMUNITY): Admit: 2022-06-03 | Payer: Medicaid Other | Admitting: Obstetrics and Gynecology

## 2024-09-09 ENCOUNTER — Ambulatory Visit

## 2024-09-09 DIAGNOSIS — Z23 Encounter for immunization: Secondary | ICD-10-CM
